# Patient Record
Sex: Female | Born: 1960 | Race: White | Hispanic: No | Marital: Single
Health system: Southern US, Community
[De-identification: ages and names within clinical notes are randomized; demographics above are authoritative.]

---

## 2000-03-27 ENCOUNTER — Encounter: Payer: Self-pay | Admitting: Unknown Physician Specialty

## 2000-03-27 ENCOUNTER — Encounter: Admission: RE | Admit: 2000-03-27 | Discharge: 2000-03-27 | Payer: Self-pay | Admitting: Unknown Physician Specialty

## 2001-11-28 ENCOUNTER — Ambulatory Visit (HOSPITAL_COMMUNITY): Admission: RE | Admit: 2001-11-28 | Discharge: 2001-11-28 | Payer: Self-pay | Admitting: Family Medicine

## 2001-11-28 ENCOUNTER — Encounter: Payer: Self-pay | Admitting: Family Medicine

## 2002-03-31 ENCOUNTER — Encounter: Admission: RE | Admit: 2002-03-31 | Discharge: 2002-03-31 | Payer: Self-pay | Admitting: Unknown Physician Specialty

## 2002-03-31 ENCOUNTER — Encounter: Payer: Self-pay | Admitting: Unknown Physician Specialty

## 2003-04-02 ENCOUNTER — Encounter: Admission: RE | Admit: 2003-04-02 | Discharge: 2003-04-02 | Payer: Self-pay | Admitting: Unknown Physician Specialty

## 2003-04-02 ENCOUNTER — Encounter: Payer: Self-pay | Admitting: Unknown Physician Specialty

## 2004-03-31 ENCOUNTER — Encounter: Admission: RE | Admit: 2004-03-31 | Discharge: 2004-03-31 | Payer: Self-pay | Admitting: Unknown Physician Specialty

## 2005-04-06 ENCOUNTER — Encounter: Admission: RE | Admit: 2005-04-06 | Discharge: 2005-04-06 | Payer: Self-pay | Admitting: Unknown Physician Specialty

## 2006-04-04 ENCOUNTER — Encounter: Admission: RE | Admit: 2006-04-04 | Discharge: 2006-04-04 | Payer: Self-pay | Admitting: Unknown Physician Specialty

## 2007-04-03 ENCOUNTER — Encounter: Admission: RE | Admit: 2007-04-03 | Discharge: 2007-04-03 | Payer: Self-pay | Admitting: Unknown Physician Specialty

## 2008-04-02 ENCOUNTER — Encounter: Admission: RE | Admit: 2008-04-02 | Discharge: 2008-04-02 | Payer: Self-pay | Admitting: Nephrology

## 2009-04-01 ENCOUNTER — Encounter: Admission: RE | Admit: 2009-04-01 | Discharge: 2009-04-01 | Payer: Self-pay | Admitting: Unknown Physician Specialty

## 2010-04-06 ENCOUNTER — Encounter: Admission: RE | Admit: 2010-04-06 | Discharge: 2010-04-06 | Payer: Self-pay | Admitting: Unknown Physician Specialty

## 2010-10-31 ENCOUNTER — Encounter: Payer: Self-pay | Admitting: Family Medicine

## 2011-03-22 ENCOUNTER — Other Ambulatory Visit: Payer: Self-pay | Admitting: *Deleted

## 2011-03-22 DIAGNOSIS — Z1231 Encounter for screening mammogram for malignant neoplasm of breast: Secondary | ICD-10-CM

## 2011-03-23 ENCOUNTER — Ambulatory Visit
Admission: RE | Admit: 2011-03-23 | Discharge: 2011-03-23 | Disposition: A | Discharge status: Home / Self Care | Payer: BC Managed Care – PPO | Source: Ambulatory Visit | Attending: *Deleted | Admitting: *Deleted

## 2011-03-23 DIAGNOSIS — Z1231 Encounter for screening mammogram for malignant neoplasm of breast: Secondary | ICD-10-CM

## 2012-02-26 ENCOUNTER — Other Ambulatory Visit: Payer: Self-pay | Admitting: Obstetrics and Gynecology

## 2012-02-26 DIAGNOSIS — Z1231 Encounter for screening mammogram for malignant neoplasm of breast: Secondary | ICD-10-CM

## 2012-03-21 ENCOUNTER — Ambulatory Visit
Admission: RE | Admit: 2012-03-21 | Discharge: 2012-03-21 | Disposition: A | Discharge status: Home / Self Care | Payer: BC Managed Care – PPO | Source: Ambulatory Visit | Attending: Obstetrics and Gynecology | Admitting: Obstetrics and Gynecology

## 2012-03-21 DIAGNOSIS — Z1231 Encounter for screening mammogram for malignant neoplasm of breast: Secondary | ICD-10-CM

## 2013-03-05 ENCOUNTER — Other Ambulatory Visit: Payer: Self-pay | Admitting: Obstetrics and Gynecology

## 2013-03-05 DIAGNOSIS — Z1231 Encounter for screening mammogram for malignant neoplasm of breast: Secondary | ICD-10-CM

## 2013-03-18 ENCOUNTER — Ambulatory Visit
Admission: RE | Admit: 2013-03-18 | Discharge: 2013-03-18 | Disposition: A | Discharge status: Home / Self Care | Payer: BC Managed Care – PPO | Source: Ambulatory Visit | Attending: Obstetrics and Gynecology | Admitting: Obstetrics and Gynecology

## 2013-03-18 DIAGNOSIS — Z1231 Encounter for screening mammogram for malignant neoplasm of breast: Secondary | ICD-10-CM

## 2014-02-26 ENCOUNTER — Other Ambulatory Visit: Payer: Self-pay | Admitting: Unknown Physician Specialty

## 2014-02-26 DIAGNOSIS — Z1231 Encounter for screening mammogram for malignant neoplasm of breast: Secondary | ICD-10-CM

## 2014-03-19 ENCOUNTER — Ambulatory Visit
Admission: RE | Admit: 2014-03-19 | Discharge: 2014-03-19 | Disposition: A | Discharge status: Home / Self Care | Payer: BC Managed Care – PPO | Source: Ambulatory Visit | Attending: Unknown Physician Specialty | Admitting: Unknown Physician Specialty

## 2014-03-19 DIAGNOSIS — Z1231 Encounter for screening mammogram for malignant neoplasm of breast: Secondary | ICD-10-CM

## 2014-03-24 ENCOUNTER — Other Ambulatory Visit: Payer: Self-pay | Admitting: Unknown Physician Specialty

## 2014-03-24 DIAGNOSIS — R928 Other abnormal and inconclusive findings on diagnostic imaging of breast: Secondary | ICD-10-CM

## 2014-04-01 ENCOUNTER — Ambulatory Visit
Admission: RE | Admit: 2014-04-01 | Discharge: 2014-04-01 | Disposition: A | Discharge status: Home / Self Care | Payer: BC Managed Care – PPO | Source: Ambulatory Visit | Attending: Unknown Physician Specialty | Admitting: Unknown Physician Specialty

## 2014-04-01 DIAGNOSIS — R928 Other abnormal and inconclusive findings on diagnostic imaging of breast: Secondary | ICD-10-CM

## 2015-02-25 ENCOUNTER — Other Ambulatory Visit: Payer: Self-pay

## 2015-02-25 DIAGNOSIS — Z1231 Encounter for screening mammogram for malignant neoplasm of breast: Secondary | ICD-10-CM

## 2015-03-17 ENCOUNTER — Ambulatory Visit
Admission: RE | Admit: 2015-03-17 | Discharge: 2015-03-17 | Disposition: A | Discharge status: Home / Self Care | Payer: BLUE CROSS/BLUE SHIELD | Source: Ambulatory Visit

## 2015-03-17 DIAGNOSIS — Z1231 Encounter for screening mammogram for malignant neoplasm of breast: Secondary | ICD-10-CM

## 2016-03-01 ENCOUNTER — Other Ambulatory Visit: Payer: Self-pay

## 2016-03-01 DIAGNOSIS — Z1231 Encounter for screening mammogram for malignant neoplasm of breast: Secondary | ICD-10-CM

## 2016-03-14 ENCOUNTER — Ambulatory Visit
Admission: RE | Admit: 2016-03-14 | Discharge: 2016-03-14 | Disposition: A | Discharge status: Home / Self Care | Payer: BLUE CROSS/BLUE SHIELD | Source: Ambulatory Visit

## 2016-03-14 DIAGNOSIS — Z1231 Encounter for screening mammogram for malignant neoplasm of breast: Secondary | ICD-10-CM

## 2017-01-18 ENCOUNTER — Other Ambulatory Visit (INDEPENDENT_AMBULATORY_CARE_PROVIDER_SITE_OTHER): Payer: Self-pay | Admitting: Orthopaedic Surgery

## 2017-01-18 NOTE — Telephone Encounter (Signed)
PLEASE ADVISE.

## 2017-01-24 DIAGNOSIS — Z01419 Encounter for gynecological examination (general) (routine) without abnormal findings: Secondary | ICD-10-CM | POA: Diagnosis not present

## 2017-01-24 DIAGNOSIS — Z6823 Body mass index (BMI) 23.0-23.9, adult: Secondary | ICD-10-CM | POA: Diagnosis not present

## 2017-02-28 ENCOUNTER — Other Ambulatory Visit: Payer: Self-pay | Admitting: Obstetrics and Gynecology

## 2017-02-28 DIAGNOSIS — Z1231 Encounter for screening mammogram for malignant neoplasm of breast: Secondary | ICD-10-CM

## 2017-03-21 ENCOUNTER — Ambulatory Visit
Admission: RE | Admit: 2017-03-21 | Discharge: 2017-03-21 | Disposition: A | Discharge status: Home / Self Care | Payer: BLUE CROSS/BLUE SHIELD | Source: Ambulatory Visit | Attending: Obstetrics and Gynecology | Admitting: Obstetrics and Gynecology

## 2017-03-21 DIAGNOSIS — Z1231 Encounter for screening mammogram for malignant neoplasm of breast: Secondary | ICD-10-CM | POA: Diagnosis not present

## 2017-06-26 DIAGNOSIS — M9905 Segmental and somatic dysfunction of pelvic region: Secondary | ICD-10-CM | POA: Diagnosis not present

## 2017-06-26 DIAGNOSIS — M9903 Segmental and somatic dysfunction of lumbar region: Secondary | ICD-10-CM | POA: Diagnosis not present

## 2017-06-26 DIAGNOSIS — M5441 Lumbago with sciatica, right side: Secondary | ICD-10-CM | POA: Diagnosis not present

## 2017-06-26 DIAGNOSIS — M7071 Other bursitis of hip, right hip: Secondary | ICD-10-CM | POA: Diagnosis not present

## 2017-07-02 DIAGNOSIS — M5441 Lumbago with sciatica, right side: Secondary | ICD-10-CM | POA: Diagnosis not present

## 2017-07-02 DIAGNOSIS — M9903 Segmental and somatic dysfunction of lumbar region: Secondary | ICD-10-CM | POA: Diagnosis not present

## 2017-07-02 DIAGNOSIS — M7071 Other bursitis of hip, right hip: Secondary | ICD-10-CM | POA: Diagnosis not present

## 2017-07-02 DIAGNOSIS — M9905 Segmental and somatic dysfunction of pelvic region: Secondary | ICD-10-CM | POA: Diagnosis not present

## 2017-07-05 DIAGNOSIS — M9903 Segmental and somatic dysfunction of lumbar region: Secondary | ICD-10-CM | POA: Diagnosis not present

## 2017-07-05 DIAGNOSIS — M7071 Other bursitis of hip, right hip: Secondary | ICD-10-CM | POA: Diagnosis not present

## 2017-07-05 DIAGNOSIS — M9905 Segmental and somatic dysfunction of pelvic region: Secondary | ICD-10-CM | POA: Diagnosis not present

## 2017-07-05 DIAGNOSIS — M5441 Lumbago with sciatica, right side: Secondary | ICD-10-CM | POA: Diagnosis not present

## 2017-07-11 DIAGNOSIS — M7071 Other bursitis of hip, right hip: Secondary | ICD-10-CM | POA: Diagnosis not present

## 2017-07-11 DIAGNOSIS — M9903 Segmental and somatic dysfunction of lumbar region: Secondary | ICD-10-CM | POA: Diagnosis not present

## 2017-07-11 DIAGNOSIS — M9905 Segmental and somatic dysfunction of pelvic region: Secondary | ICD-10-CM | POA: Diagnosis not present

## 2017-07-11 DIAGNOSIS — M5441 Lumbago with sciatica, right side: Secondary | ICD-10-CM | POA: Diagnosis not present

## 2017-07-18 DIAGNOSIS — M9905 Segmental and somatic dysfunction of pelvic region: Secondary | ICD-10-CM | POA: Diagnosis not present

## 2017-07-18 DIAGNOSIS — M9903 Segmental and somatic dysfunction of lumbar region: Secondary | ICD-10-CM | POA: Diagnosis not present

## 2017-07-18 DIAGNOSIS — M7071 Other bursitis of hip, right hip: Secondary | ICD-10-CM | POA: Diagnosis not present

## 2017-07-18 DIAGNOSIS — M5441 Lumbago with sciatica, right side: Secondary | ICD-10-CM | POA: Diagnosis not present

## 2017-07-23 DIAGNOSIS — M9903 Segmental and somatic dysfunction of lumbar region: Secondary | ICD-10-CM | POA: Diagnosis not present

## 2017-07-23 DIAGNOSIS — M7071 Other bursitis of hip, right hip: Secondary | ICD-10-CM | POA: Diagnosis not present

## 2017-07-23 DIAGNOSIS — M9905 Segmental and somatic dysfunction of pelvic region: Secondary | ICD-10-CM | POA: Diagnosis not present

## 2017-07-23 DIAGNOSIS — M5441 Lumbago with sciatica, right side: Secondary | ICD-10-CM | POA: Diagnosis not present

## 2017-08-01 DIAGNOSIS — M9903 Segmental and somatic dysfunction of lumbar region: Secondary | ICD-10-CM | POA: Diagnosis not present

## 2017-08-01 DIAGNOSIS — M9905 Segmental and somatic dysfunction of pelvic region: Secondary | ICD-10-CM | POA: Diagnosis not present

## 2017-08-01 DIAGNOSIS — M7071 Other bursitis of hip, right hip: Secondary | ICD-10-CM | POA: Diagnosis not present

## 2017-08-01 DIAGNOSIS — M5441 Lumbago with sciatica, right side: Secondary | ICD-10-CM | POA: Diagnosis not present

## 2017-08-15 DIAGNOSIS — M7071 Other bursitis of hip, right hip: Secondary | ICD-10-CM | POA: Diagnosis not present

## 2017-08-15 DIAGNOSIS — M9905 Segmental and somatic dysfunction of pelvic region: Secondary | ICD-10-CM | POA: Diagnosis not present

## 2017-08-15 DIAGNOSIS — M9903 Segmental and somatic dysfunction of lumbar region: Secondary | ICD-10-CM | POA: Diagnosis not present

## 2017-08-15 DIAGNOSIS — M5441 Lumbago with sciatica, right side: Secondary | ICD-10-CM | POA: Diagnosis not present

## 2017-09-14 DIAGNOSIS — M7071 Other bursitis of hip, right hip: Secondary | ICD-10-CM | POA: Diagnosis not present

## 2017-09-14 DIAGNOSIS — M9903 Segmental and somatic dysfunction of lumbar region: Secondary | ICD-10-CM | POA: Diagnosis not present

## 2017-09-14 DIAGNOSIS — M5441 Lumbago with sciatica, right side: Secondary | ICD-10-CM | POA: Diagnosis not present

## 2017-09-14 DIAGNOSIS — M9905 Segmental and somatic dysfunction of pelvic region: Secondary | ICD-10-CM | POA: Diagnosis not present

## 2017-09-26 DIAGNOSIS — M9903 Segmental and somatic dysfunction of lumbar region: Secondary | ICD-10-CM | POA: Diagnosis not present

## 2017-09-26 DIAGNOSIS — M7071 Other bursitis of hip, right hip: Secondary | ICD-10-CM | POA: Diagnosis not present

## 2017-09-26 DIAGNOSIS — M5441 Lumbago with sciatica, right side: Secondary | ICD-10-CM | POA: Diagnosis not present

## 2017-09-26 DIAGNOSIS — M9905 Segmental and somatic dysfunction of pelvic region: Secondary | ICD-10-CM | POA: Diagnosis not present

## 2017-10-17 DIAGNOSIS — M7071 Other bursitis of hip, right hip: Secondary | ICD-10-CM | POA: Diagnosis not present

## 2017-10-17 DIAGNOSIS — M5441 Lumbago with sciatica, right side: Secondary | ICD-10-CM | POA: Diagnosis not present

## 2017-10-17 DIAGNOSIS — M9903 Segmental and somatic dysfunction of lumbar region: Secondary | ICD-10-CM | POA: Diagnosis not present

## 2017-10-17 DIAGNOSIS — M9905 Segmental and somatic dysfunction of pelvic region: Secondary | ICD-10-CM | POA: Diagnosis not present

## 2017-11-14 DIAGNOSIS — M9905 Segmental and somatic dysfunction of pelvic region: Secondary | ICD-10-CM | POA: Diagnosis not present

## 2017-11-14 DIAGNOSIS — M9903 Segmental and somatic dysfunction of lumbar region: Secondary | ICD-10-CM | POA: Diagnosis not present

## 2017-11-14 DIAGNOSIS — M7071 Other bursitis of hip, right hip: Secondary | ICD-10-CM | POA: Diagnosis not present

## 2017-11-14 DIAGNOSIS — M5441 Lumbago with sciatica, right side: Secondary | ICD-10-CM | POA: Diagnosis not present

## 2017-12-12 DIAGNOSIS — M9905 Segmental and somatic dysfunction of pelvic region: Secondary | ICD-10-CM | POA: Diagnosis not present

## 2017-12-12 DIAGNOSIS — M9903 Segmental and somatic dysfunction of lumbar region: Secondary | ICD-10-CM | POA: Diagnosis not present

## 2017-12-12 DIAGNOSIS — M7071 Other bursitis of hip, right hip: Secondary | ICD-10-CM | POA: Diagnosis not present

## 2017-12-12 DIAGNOSIS — M5441 Lumbago with sciatica, right side: Secondary | ICD-10-CM | POA: Diagnosis not present

## 2018-01-16 DIAGNOSIS — M9903 Segmental and somatic dysfunction of lumbar region: Secondary | ICD-10-CM | POA: Diagnosis not present

## 2018-01-16 DIAGNOSIS — M7918 Myalgia, other site: Secondary | ICD-10-CM | POA: Diagnosis not present

## 2018-01-16 DIAGNOSIS — M9905 Segmental and somatic dysfunction of pelvic region: Secondary | ICD-10-CM | POA: Diagnosis not present

## 2018-01-16 DIAGNOSIS — M9904 Segmental and somatic dysfunction of sacral region: Secondary | ICD-10-CM | POA: Diagnosis not present

## 2018-02-13 DIAGNOSIS — Z6823 Body mass index (BMI) 23.0-23.9, adult: Secondary | ICD-10-CM | POA: Diagnosis not present

## 2018-02-13 DIAGNOSIS — M9904 Segmental and somatic dysfunction of sacral region: Secondary | ICD-10-CM | POA: Diagnosis not present

## 2018-02-13 DIAGNOSIS — Z124 Encounter for screening for malignant neoplasm of cervix: Secondary | ICD-10-CM | POA: Diagnosis not present

## 2018-02-13 DIAGNOSIS — M9905 Segmental and somatic dysfunction of pelvic region: Secondary | ICD-10-CM | POA: Diagnosis not present

## 2018-02-13 DIAGNOSIS — M7918 Myalgia, other site: Secondary | ICD-10-CM | POA: Diagnosis not present

## 2018-02-13 DIAGNOSIS — Z01419 Encounter for gynecological examination (general) (routine) without abnormal findings: Secondary | ICD-10-CM | POA: Diagnosis not present

## 2018-02-13 DIAGNOSIS — M9903 Segmental and somatic dysfunction of lumbar region: Secondary | ICD-10-CM | POA: Diagnosis not present

## 2018-03-13 DIAGNOSIS — M9905 Segmental and somatic dysfunction of pelvic region: Secondary | ICD-10-CM | POA: Diagnosis not present

## 2018-03-13 DIAGNOSIS — M9903 Segmental and somatic dysfunction of lumbar region: Secondary | ICD-10-CM | POA: Diagnosis not present

## 2018-03-13 DIAGNOSIS — M7918 Myalgia, other site: Secondary | ICD-10-CM | POA: Diagnosis not present

## 2018-03-13 DIAGNOSIS — M9904 Segmental and somatic dysfunction of sacral region: Secondary | ICD-10-CM | POA: Diagnosis not present

## 2018-04-24 DIAGNOSIS — M9903 Segmental and somatic dysfunction of lumbar region: Secondary | ICD-10-CM | POA: Diagnosis not present

## 2018-04-24 DIAGNOSIS — M7918 Myalgia, other site: Secondary | ICD-10-CM | POA: Diagnosis not present

## 2018-04-24 DIAGNOSIS — M9905 Segmental and somatic dysfunction of pelvic region: Secondary | ICD-10-CM | POA: Diagnosis not present

## 2018-04-24 DIAGNOSIS — M9904 Segmental and somatic dysfunction of sacral region: Secondary | ICD-10-CM | POA: Diagnosis not present

## 2018-05-06 ENCOUNTER — Other Ambulatory Visit: Payer: Self-pay | Admitting: Obstetrics and Gynecology

## 2018-05-06 DIAGNOSIS — Z139 Encounter for screening, unspecified: Secondary | ICD-10-CM

## 2018-05-29 DIAGNOSIS — M9905 Segmental and somatic dysfunction of pelvic region: Secondary | ICD-10-CM | POA: Diagnosis not present

## 2018-05-29 DIAGNOSIS — M7918 Myalgia, other site: Secondary | ICD-10-CM | POA: Diagnosis not present

## 2018-05-29 DIAGNOSIS — M9904 Segmental and somatic dysfunction of sacral region: Secondary | ICD-10-CM | POA: Diagnosis not present

## 2018-05-29 DIAGNOSIS — M9903 Segmental and somatic dysfunction of lumbar region: Secondary | ICD-10-CM | POA: Diagnosis not present

## 2018-06-06 ENCOUNTER — Ambulatory Visit
Admission: RE | Admit: 2018-06-06 | Discharge: 2018-06-06 | Disposition: A | Discharge status: Home / Self Care | Payer: BLUE CROSS/BLUE SHIELD | Source: Ambulatory Visit | Attending: Obstetrics and Gynecology | Admitting: Obstetrics and Gynecology

## 2018-06-06 DIAGNOSIS — Z1231 Encounter for screening mammogram for malignant neoplasm of breast: Secondary | ICD-10-CM | POA: Diagnosis not present

## 2018-06-06 DIAGNOSIS — Z139 Encounter for screening, unspecified: Secondary | ICD-10-CM

## 2018-06-26 DIAGNOSIS — M9903 Segmental and somatic dysfunction of lumbar region: Secondary | ICD-10-CM | POA: Diagnosis not present

## 2018-06-26 DIAGNOSIS — M9904 Segmental and somatic dysfunction of sacral region: Secondary | ICD-10-CM | POA: Diagnosis not present

## 2018-06-26 DIAGNOSIS — M7918 Myalgia, other site: Secondary | ICD-10-CM | POA: Diagnosis not present

## 2018-06-26 DIAGNOSIS — M9905 Segmental and somatic dysfunction of pelvic region: Secondary | ICD-10-CM | POA: Diagnosis not present

## 2018-07-24 DIAGNOSIS — M9904 Segmental and somatic dysfunction of sacral region: Secondary | ICD-10-CM | POA: Diagnosis not present

## 2018-07-24 DIAGNOSIS — M9903 Segmental and somatic dysfunction of lumbar region: Secondary | ICD-10-CM | POA: Diagnosis not present

## 2018-07-24 DIAGNOSIS — M722 Plantar fascial fibromatosis: Secondary | ICD-10-CM | POA: Diagnosis not present

## 2018-07-24 DIAGNOSIS — M9905 Segmental and somatic dysfunction of pelvic region: Secondary | ICD-10-CM | POA: Diagnosis not present

## 2018-08-28 DIAGNOSIS — M9904 Segmental and somatic dysfunction of sacral region: Secondary | ICD-10-CM | POA: Diagnosis not present

## 2018-08-28 DIAGNOSIS — M9903 Segmental and somatic dysfunction of lumbar region: Secondary | ICD-10-CM | POA: Diagnosis not present

## 2018-08-28 DIAGNOSIS — M9905 Segmental and somatic dysfunction of pelvic region: Secondary | ICD-10-CM | POA: Diagnosis not present

## 2018-08-28 DIAGNOSIS — M722 Plantar fascial fibromatosis: Secondary | ICD-10-CM | POA: Diagnosis not present

## 2018-09-25 DIAGNOSIS — M9904 Segmental and somatic dysfunction of sacral region: Secondary | ICD-10-CM | POA: Diagnosis not present

## 2018-09-25 DIAGNOSIS — M9903 Segmental and somatic dysfunction of lumbar region: Secondary | ICD-10-CM | POA: Diagnosis not present

## 2018-09-25 DIAGNOSIS — M9905 Segmental and somatic dysfunction of pelvic region: Secondary | ICD-10-CM | POA: Diagnosis not present

## 2018-09-25 DIAGNOSIS — M722 Plantar fascial fibromatosis: Secondary | ICD-10-CM | POA: Diagnosis not present

## 2018-10-30 DIAGNOSIS — M722 Plantar fascial fibromatosis: Secondary | ICD-10-CM | POA: Diagnosis not present

## 2018-10-30 DIAGNOSIS — M9904 Segmental and somatic dysfunction of sacral region: Secondary | ICD-10-CM | POA: Diagnosis not present

## 2018-10-30 DIAGNOSIS — M9905 Segmental and somatic dysfunction of pelvic region: Secondary | ICD-10-CM | POA: Diagnosis not present

## 2018-10-30 DIAGNOSIS — M9903 Segmental and somatic dysfunction of lumbar region: Secondary | ICD-10-CM | POA: Diagnosis not present

## 2018-12-04 DIAGNOSIS — M9905 Segmental and somatic dysfunction of pelvic region: Secondary | ICD-10-CM | POA: Diagnosis not present

## 2018-12-04 DIAGNOSIS — M9904 Segmental and somatic dysfunction of sacral region: Secondary | ICD-10-CM | POA: Diagnosis not present

## 2018-12-04 DIAGNOSIS — M9903 Segmental and somatic dysfunction of lumbar region: Secondary | ICD-10-CM | POA: Diagnosis not present

## 2018-12-04 DIAGNOSIS — M722 Plantar fascial fibromatosis: Secondary | ICD-10-CM | POA: Diagnosis not present

## 2019-01-15 DIAGNOSIS — M7062 Trochanteric bursitis, left hip: Secondary | ICD-10-CM | POA: Diagnosis not present

## 2019-01-15 DIAGNOSIS — M9905 Segmental and somatic dysfunction of pelvic region: Secondary | ICD-10-CM | POA: Diagnosis not present

## 2019-01-15 DIAGNOSIS — M9904 Segmental and somatic dysfunction of sacral region: Secondary | ICD-10-CM | POA: Diagnosis not present

## 2019-01-15 DIAGNOSIS — M9903 Segmental and somatic dysfunction of lumbar region: Secondary | ICD-10-CM | POA: Diagnosis not present

## 2019-01-20 DIAGNOSIS — M7062 Trochanteric bursitis, left hip: Secondary | ICD-10-CM | POA: Diagnosis not present

## 2019-01-20 DIAGNOSIS — M9905 Segmental and somatic dysfunction of pelvic region: Secondary | ICD-10-CM | POA: Diagnosis not present

## 2019-01-20 DIAGNOSIS — M9903 Segmental and somatic dysfunction of lumbar region: Secondary | ICD-10-CM | POA: Diagnosis not present

## 2019-01-20 DIAGNOSIS — M9904 Segmental and somatic dysfunction of sacral region: Secondary | ICD-10-CM | POA: Diagnosis not present

## 2019-01-27 DIAGNOSIS — M9904 Segmental and somatic dysfunction of sacral region: Secondary | ICD-10-CM | POA: Diagnosis not present

## 2019-01-27 DIAGNOSIS — M9903 Segmental and somatic dysfunction of lumbar region: Secondary | ICD-10-CM | POA: Diagnosis not present

## 2019-01-27 DIAGNOSIS — M7062 Trochanteric bursitis, left hip: Secondary | ICD-10-CM | POA: Diagnosis not present

## 2019-01-27 DIAGNOSIS — M9905 Segmental and somatic dysfunction of pelvic region: Secondary | ICD-10-CM | POA: Diagnosis not present

## 2019-02-04 DIAGNOSIS — M9903 Segmental and somatic dysfunction of lumbar region: Secondary | ICD-10-CM | POA: Diagnosis not present

## 2019-02-04 DIAGNOSIS — M9905 Segmental and somatic dysfunction of pelvic region: Secondary | ICD-10-CM | POA: Diagnosis not present

## 2019-02-04 DIAGNOSIS — M7062 Trochanteric bursitis, left hip: Secondary | ICD-10-CM | POA: Diagnosis not present

## 2019-02-04 DIAGNOSIS — M9904 Segmental and somatic dysfunction of sacral region: Secondary | ICD-10-CM | POA: Diagnosis not present

## 2019-02-17 DIAGNOSIS — M9903 Segmental and somatic dysfunction of lumbar region: Secondary | ICD-10-CM | POA: Diagnosis not present

## 2019-02-17 DIAGNOSIS — M9904 Segmental and somatic dysfunction of sacral region: Secondary | ICD-10-CM | POA: Diagnosis not present

## 2019-02-17 DIAGNOSIS — M7062 Trochanteric bursitis, left hip: Secondary | ICD-10-CM | POA: Diagnosis not present

## 2019-02-17 DIAGNOSIS — M9905 Segmental and somatic dysfunction of pelvic region: Secondary | ICD-10-CM | POA: Diagnosis not present

## 2019-03-04 DIAGNOSIS — M9904 Segmental and somatic dysfunction of sacral region: Secondary | ICD-10-CM | POA: Diagnosis not present

## 2019-03-04 DIAGNOSIS — M9903 Segmental and somatic dysfunction of lumbar region: Secondary | ICD-10-CM | POA: Diagnosis not present

## 2019-03-04 DIAGNOSIS — M7062 Trochanteric bursitis, left hip: Secondary | ICD-10-CM | POA: Diagnosis not present

## 2019-03-04 DIAGNOSIS — M9905 Segmental and somatic dysfunction of pelvic region: Secondary | ICD-10-CM | POA: Diagnosis not present

## 2019-03-12 DIAGNOSIS — Z6824 Body mass index (BMI) 24.0-24.9, adult: Secondary | ICD-10-CM | POA: Diagnosis not present

## 2019-03-12 DIAGNOSIS — Z01419 Encounter for gynecological examination (general) (routine) without abnormal findings: Secondary | ICD-10-CM | POA: Diagnosis not present

## 2019-03-26 DIAGNOSIS — M7062 Trochanteric bursitis, left hip: Secondary | ICD-10-CM | POA: Diagnosis not present

## 2019-03-26 DIAGNOSIS — M9904 Segmental and somatic dysfunction of sacral region: Secondary | ICD-10-CM | POA: Diagnosis not present

## 2019-03-26 DIAGNOSIS — M9905 Segmental and somatic dysfunction of pelvic region: Secondary | ICD-10-CM | POA: Diagnosis not present

## 2019-03-26 DIAGNOSIS — M9903 Segmental and somatic dysfunction of lumbar region: Secondary | ICD-10-CM | POA: Diagnosis not present

## 2019-04-23 DIAGNOSIS — M7062 Trochanteric bursitis, left hip: Secondary | ICD-10-CM | POA: Diagnosis not present

## 2019-04-23 DIAGNOSIS — M9905 Segmental and somatic dysfunction of pelvic region: Secondary | ICD-10-CM | POA: Diagnosis not present

## 2019-04-23 DIAGNOSIS — M9902 Segmental and somatic dysfunction of thoracic region: Secondary | ICD-10-CM | POA: Diagnosis not present

## 2019-04-23 DIAGNOSIS — M9903 Segmental and somatic dysfunction of lumbar region: Secondary | ICD-10-CM | POA: Diagnosis not present

## 2019-04-24 ENCOUNTER — Other Ambulatory Visit: Payer: Self-pay | Admitting: Obstetrics and Gynecology

## 2019-04-24 DIAGNOSIS — Z1231 Encounter for screening mammogram for malignant neoplasm of breast: Secondary | ICD-10-CM

## 2019-05-21 DIAGNOSIS — M9902 Segmental and somatic dysfunction of thoracic region: Secondary | ICD-10-CM | POA: Diagnosis not present

## 2019-05-21 DIAGNOSIS — M9903 Segmental and somatic dysfunction of lumbar region: Secondary | ICD-10-CM | POA: Diagnosis not present

## 2019-05-21 DIAGNOSIS — M9904 Segmental and somatic dysfunction of sacral region: Secondary | ICD-10-CM | POA: Diagnosis not present

## 2019-05-21 DIAGNOSIS — M9905 Segmental and somatic dysfunction of pelvic region: Secondary | ICD-10-CM | POA: Diagnosis not present

## 2019-06-09 ENCOUNTER — Ambulatory Visit
Admission: RE | Admit: 2019-06-09 | Discharge: 2019-06-09 | Disposition: A | Discharge status: Home / Self Care | Payer: BC Managed Care – PPO | Source: Ambulatory Visit | Attending: Obstetrics and Gynecology | Admitting: Obstetrics and Gynecology

## 2019-06-09 ENCOUNTER — Other Ambulatory Visit: Payer: Self-pay

## 2019-06-09 DIAGNOSIS — Z1231 Encounter for screening mammogram for malignant neoplasm of breast: Secondary | ICD-10-CM

## 2019-07-02 DIAGNOSIS — M9903 Segmental and somatic dysfunction of lumbar region: Secondary | ICD-10-CM | POA: Diagnosis not present

## 2019-07-02 DIAGNOSIS — M9902 Segmental and somatic dysfunction of thoracic region: Secondary | ICD-10-CM | POA: Diagnosis not present

## 2019-07-02 DIAGNOSIS — M9905 Segmental and somatic dysfunction of pelvic region: Secondary | ICD-10-CM | POA: Diagnosis not present

## 2019-07-02 DIAGNOSIS — M9904 Segmental and somatic dysfunction of sacral region: Secondary | ICD-10-CM | POA: Diagnosis not present

## 2019-07-17 DIAGNOSIS — Z23 Encounter for immunization: Secondary | ICD-10-CM | POA: Diagnosis not present

## 2019-08-13 DIAGNOSIS — M9902 Segmental and somatic dysfunction of thoracic region: Secondary | ICD-10-CM | POA: Diagnosis not present

## 2019-08-13 DIAGNOSIS — M9905 Segmental and somatic dysfunction of pelvic region: Secondary | ICD-10-CM | POA: Diagnosis not present

## 2019-08-13 DIAGNOSIS — M9903 Segmental and somatic dysfunction of lumbar region: Secondary | ICD-10-CM | POA: Diagnosis not present

## 2019-08-13 DIAGNOSIS — M9904 Segmental and somatic dysfunction of sacral region: Secondary | ICD-10-CM | POA: Diagnosis not present

## 2019-09-10 DIAGNOSIS — M9904 Segmental and somatic dysfunction of sacral region: Secondary | ICD-10-CM | POA: Diagnosis not present

## 2019-09-10 DIAGNOSIS — M9905 Segmental and somatic dysfunction of pelvic region: Secondary | ICD-10-CM | POA: Diagnosis not present

## 2019-09-10 DIAGNOSIS — M7672 Peroneal tendinitis, left leg: Secondary | ICD-10-CM | POA: Diagnosis not present

## 2019-09-10 DIAGNOSIS — M9903 Segmental and somatic dysfunction of lumbar region: Secondary | ICD-10-CM | POA: Diagnosis not present

## 2019-09-11 ENCOUNTER — Other Ambulatory Visit: Payer: Self-pay | Admitting: Chiropractic Medicine

## 2019-09-11 ENCOUNTER — Ambulatory Visit
Admission: RE | Admit: 2019-09-11 | Discharge: 2019-09-11 | Disposition: A | Discharge status: Home / Self Care | Payer: BC Managed Care – PPO | Source: Ambulatory Visit | Attending: Chiropractic Medicine | Admitting: Chiropractic Medicine

## 2019-09-11 DIAGNOSIS — M25572 Pain in left ankle and joints of left foot: Secondary | ICD-10-CM

## 2019-09-11 DIAGNOSIS — M19072 Primary osteoarthritis, left ankle and foot: Secondary | ICD-10-CM | POA: Diagnosis not present

## 2019-09-17 DIAGNOSIS — M9903 Segmental and somatic dysfunction of lumbar region: Secondary | ICD-10-CM | POA: Diagnosis not present

## 2019-09-17 DIAGNOSIS — M9904 Segmental and somatic dysfunction of sacral region: Secondary | ICD-10-CM | POA: Diagnosis not present

## 2019-09-17 DIAGNOSIS — M9905 Segmental and somatic dysfunction of pelvic region: Secondary | ICD-10-CM | POA: Diagnosis not present

## 2019-09-17 DIAGNOSIS — M7672 Peroneal tendinitis, left leg: Secondary | ICD-10-CM | POA: Diagnosis not present

## 2019-09-24 DIAGNOSIS — M9905 Segmental and somatic dysfunction of pelvic region: Secondary | ICD-10-CM | POA: Diagnosis not present

## 2019-09-24 DIAGNOSIS — M9904 Segmental and somatic dysfunction of sacral region: Secondary | ICD-10-CM | POA: Diagnosis not present

## 2019-09-24 DIAGNOSIS — M7672 Peroneal tendinitis, left leg: Secondary | ICD-10-CM | POA: Diagnosis not present

## 2019-09-24 DIAGNOSIS — M9903 Segmental and somatic dysfunction of lumbar region: Secondary | ICD-10-CM | POA: Diagnosis not present

## 2019-10-13 DIAGNOSIS — M9903 Segmental and somatic dysfunction of lumbar region: Secondary | ICD-10-CM | POA: Diagnosis not present

## 2019-10-13 DIAGNOSIS — M9905 Segmental and somatic dysfunction of pelvic region: Secondary | ICD-10-CM | POA: Diagnosis not present

## 2019-10-13 DIAGNOSIS — M9904 Segmental and somatic dysfunction of sacral region: Secondary | ICD-10-CM | POA: Diagnosis not present

## 2019-10-13 DIAGNOSIS — M7672 Peroneal tendinitis, left leg: Secondary | ICD-10-CM | POA: Diagnosis not present

## 2019-10-24 DIAGNOSIS — M9905 Segmental and somatic dysfunction of pelvic region: Secondary | ICD-10-CM | POA: Diagnosis not present

## 2019-10-24 DIAGNOSIS — M7672 Peroneal tendinitis, left leg: Secondary | ICD-10-CM | POA: Diagnosis not present

## 2019-10-24 DIAGNOSIS — M9903 Segmental and somatic dysfunction of lumbar region: Secondary | ICD-10-CM | POA: Diagnosis not present

## 2019-10-24 DIAGNOSIS — M9904 Segmental and somatic dysfunction of sacral region: Secondary | ICD-10-CM | POA: Diagnosis not present

## 2019-11-14 DIAGNOSIS — M7672 Peroneal tendinitis, left leg: Secondary | ICD-10-CM | POA: Diagnosis not present

## 2019-11-14 DIAGNOSIS — M9904 Segmental and somatic dysfunction of sacral region: Secondary | ICD-10-CM | POA: Diagnosis not present

## 2019-11-14 DIAGNOSIS — M9903 Segmental and somatic dysfunction of lumbar region: Secondary | ICD-10-CM | POA: Diagnosis not present

## 2019-11-14 DIAGNOSIS — M9905 Segmental and somatic dysfunction of pelvic region: Secondary | ICD-10-CM | POA: Diagnosis not present

## 2019-11-28 DIAGNOSIS — M9904 Segmental and somatic dysfunction of sacral region: Secondary | ICD-10-CM | POA: Diagnosis not present

## 2019-11-28 DIAGNOSIS — M9903 Segmental and somatic dysfunction of lumbar region: Secondary | ICD-10-CM | POA: Diagnosis not present

## 2019-11-28 DIAGNOSIS — M9905 Segmental and somatic dysfunction of pelvic region: Secondary | ICD-10-CM | POA: Diagnosis not present

## 2019-11-28 DIAGNOSIS — M7672 Peroneal tendinitis, left leg: Secondary | ICD-10-CM | POA: Diagnosis not present

## 2019-12-19 DIAGNOSIS — M7672 Peroneal tendinitis, left leg: Secondary | ICD-10-CM | POA: Diagnosis not present

## 2019-12-19 DIAGNOSIS — M9905 Segmental and somatic dysfunction of pelvic region: Secondary | ICD-10-CM | POA: Diagnosis not present

## 2019-12-19 DIAGNOSIS — M9904 Segmental and somatic dysfunction of sacral region: Secondary | ICD-10-CM | POA: Diagnosis not present

## 2019-12-19 DIAGNOSIS — M9903 Segmental and somatic dysfunction of lumbar region: Secondary | ICD-10-CM | POA: Diagnosis not present

## 2020-01-09 DIAGNOSIS — M7672 Peroneal tendinitis, left leg: Secondary | ICD-10-CM | POA: Diagnosis not present

## 2020-01-09 DIAGNOSIS — M9903 Segmental and somatic dysfunction of lumbar region: Secondary | ICD-10-CM | POA: Diagnosis not present

## 2020-01-09 DIAGNOSIS — M9905 Segmental and somatic dysfunction of pelvic region: Secondary | ICD-10-CM | POA: Diagnosis not present

## 2020-01-09 DIAGNOSIS — M9904 Segmental and somatic dysfunction of sacral region: Secondary | ICD-10-CM | POA: Diagnosis not present

## 2020-02-05 DIAGNOSIS — M7672 Peroneal tendinitis, left leg: Secondary | ICD-10-CM | POA: Diagnosis not present

## 2020-02-05 DIAGNOSIS — M9903 Segmental and somatic dysfunction of lumbar region: Secondary | ICD-10-CM | POA: Diagnosis not present

## 2020-02-05 DIAGNOSIS — M9904 Segmental and somatic dysfunction of sacral region: Secondary | ICD-10-CM | POA: Diagnosis not present

## 2020-02-05 DIAGNOSIS — M9905 Segmental and somatic dysfunction of pelvic region: Secondary | ICD-10-CM | POA: Diagnosis not present

## 2020-03-16 DIAGNOSIS — M9903 Segmental and somatic dysfunction of lumbar region: Secondary | ICD-10-CM | POA: Diagnosis not present

## 2020-03-16 DIAGNOSIS — M7672 Peroneal tendinitis, left leg: Secondary | ICD-10-CM | POA: Diagnosis not present

## 2020-03-16 DIAGNOSIS — M9905 Segmental and somatic dysfunction of pelvic region: Secondary | ICD-10-CM | POA: Diagnosis not present

## 2020-03-16 DIAGNOSIS — M9904 Segmental and somatic dysfunction of sacral region: Secondary | ICD-10-CM | POA: Diagnosis not present

## 2020-03-31 DIAGNOSIS — Z01419 Encounter for gynecological examination (general) (routine) without abnormal findings: Secondary | ICD-10-CM | POA: Diagnosis not present

## 2020-03-31 DIAGNOSIS — Z6824 Body mass index (BMI) 24.0-24.9, adult: Secondary | ICD-10-CM | POA: Diagnosis not present

## 2020-04-26 ENCOUNTER — Other Ambulatory Visit: Payer: Self-pay | Admitting: Family Medicine

## 2020-04-26 DIAGNOSIS — Z1231 Encounter for screening mammogram for malignant neoplasm of breast: Secondary | ICD-10-CM

## 2020-05-12 DIAGNOSIS — J0110 Acute frontal sinusitis, unspecified: Secondary | ICD-10-CM | POA: Diagnosis not present

## 2020-05-13 DIAGNOSIS — J069 Acute upper respiratory infection, unspecified: Secondary | ICD-10-CM | POA: Diagnosis not present

## 2020-05-22 DIAGNOSIS — H6981 Other specified disorders of Eustachian tube, right ear: Secondary | ICD-10-CM | POA: Diagnosis not present

## 2020-05-31 DIAGNOSIS — H6981 Other specified disorders of Eustachian tube, right ear: Secondary | ICD-10-CM | POA: Diagnosis not present

## 2020-06-10 ENCOUNTER — Ambulatory Visit
Admission: RE | Admit: 2020-06-10 | Discharge: 2020-06-10 | Disposition: A | Discharge status: Home / Self Care | Payer: Self-pay | Source: Ambulatory Visit | Attending: Family Medicine | Admitting: Family Medicine

## 2020-06-10 ENCOUNTER — Other Ambulatory Visit: Payer: Self-pay

## 2020-06-10 DIAGNOSIS — Z1231 Encounter for screening mammogram for malignant neoplasm of breast: Secondary | ICD-10-CM | POA: Diagnosis not present

## 2020-07-26 DIAGNOSIS — N3000 Acute cystitis without hematuria: Secondary | ICD-10-CM | POA: Diagnosis not present

## 2020-08-27 DIAGNOSIS — H6983 Other specified disorders of Eustachian tube, bilateral: Secondary | ICD-10-CM | POA: Diagnosis not present

## 2020-08-27 DIAGNOSIS — H903 Sensorineural hearing loss, bilateral: Secondary | ICD-10-CM | POA: Diagnosis not present

## 2020-09-28 IMAGING — MG DIGITAL SCREENING BILAT W/ TOMO W/ CAD
8 series · 9 of 24 positions shown · non-contrast
Comparison: Previous exam(s).

CLINICAL DATA: Screening.

EXAM:
DIGITAL SCREENING BILATERAL MAMMOGRAM WITH TOMO AND CAD

[L CC synth-2D]
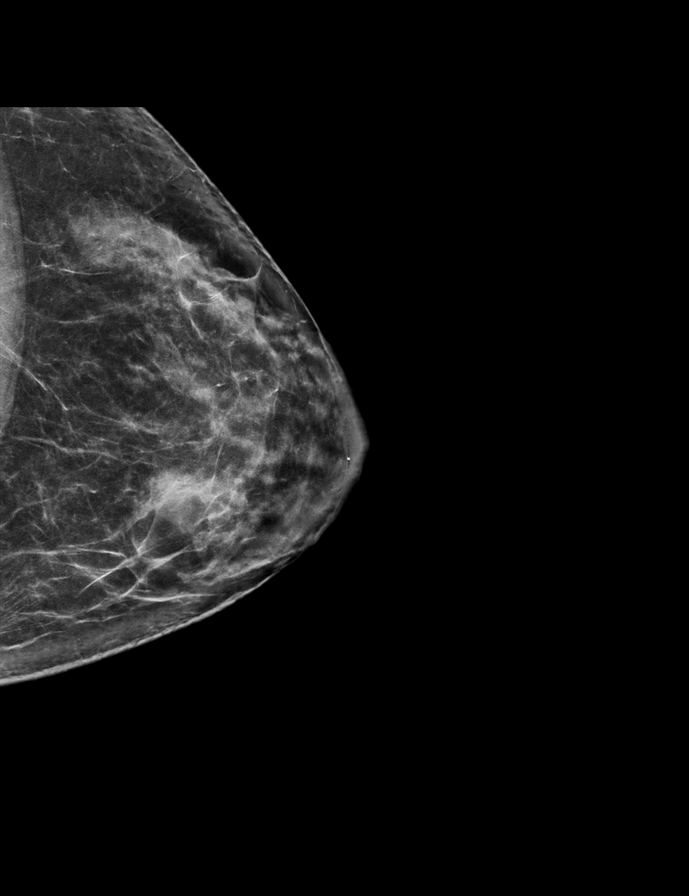

[R CC synth-2D]
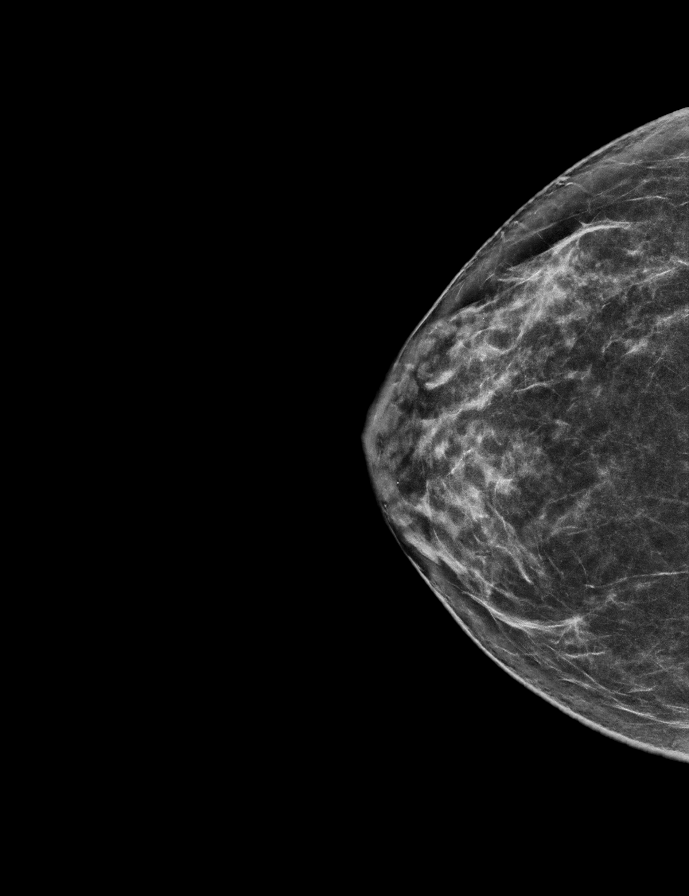

[R MLO synth-2D]
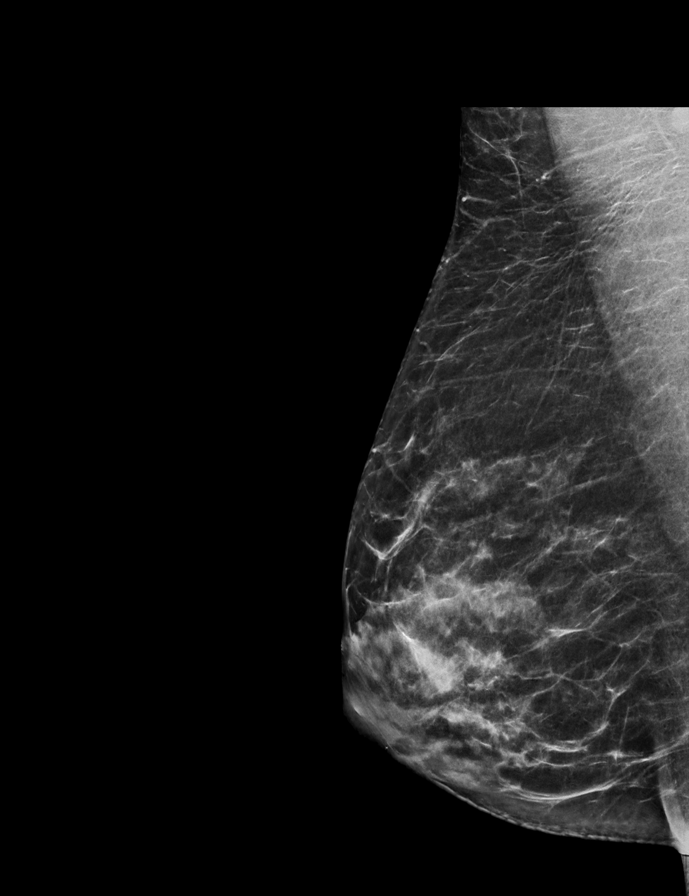

[L MLO synth-2D]
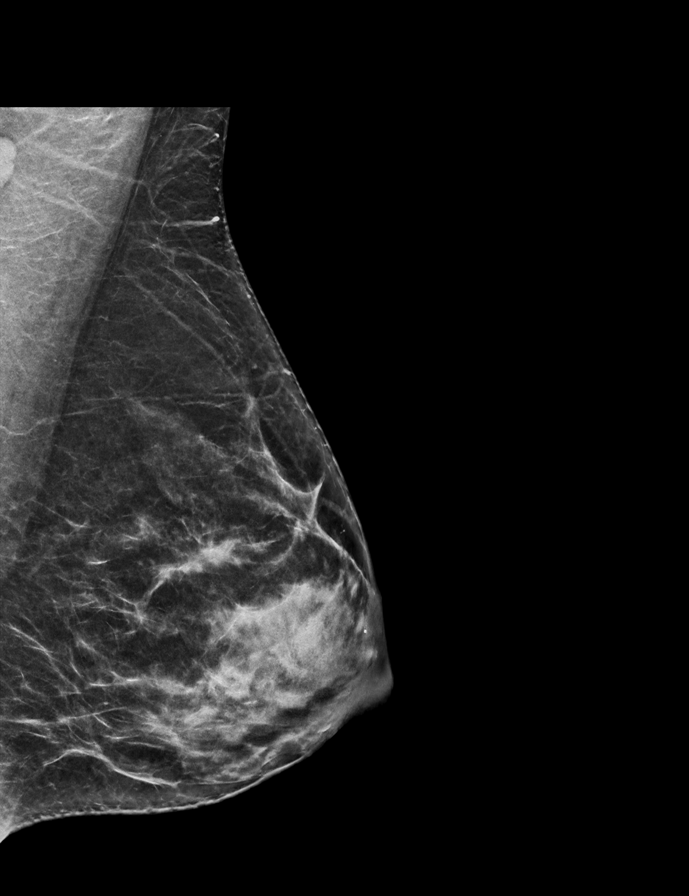

[L CC tomo · 2 of 66 frames shown]
[frame 22/66]
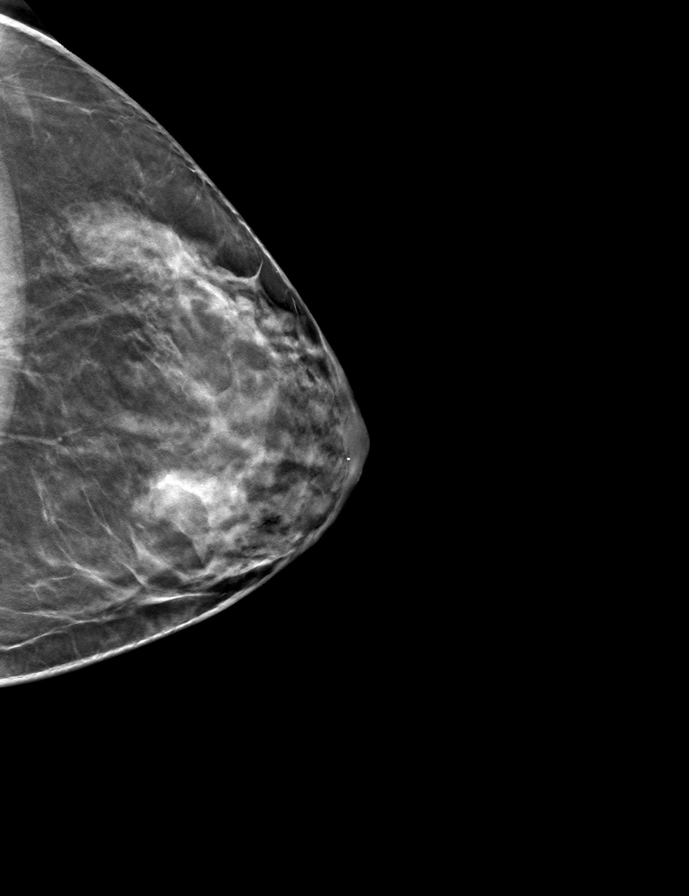
[frame 33/66]
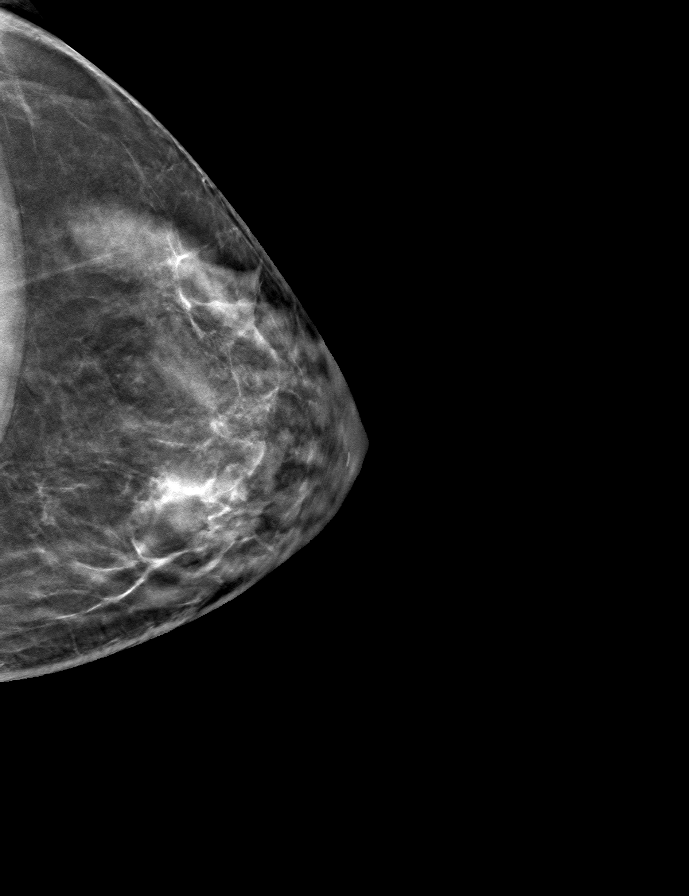

[L MLO tomo · tomo slice 31/61.0]
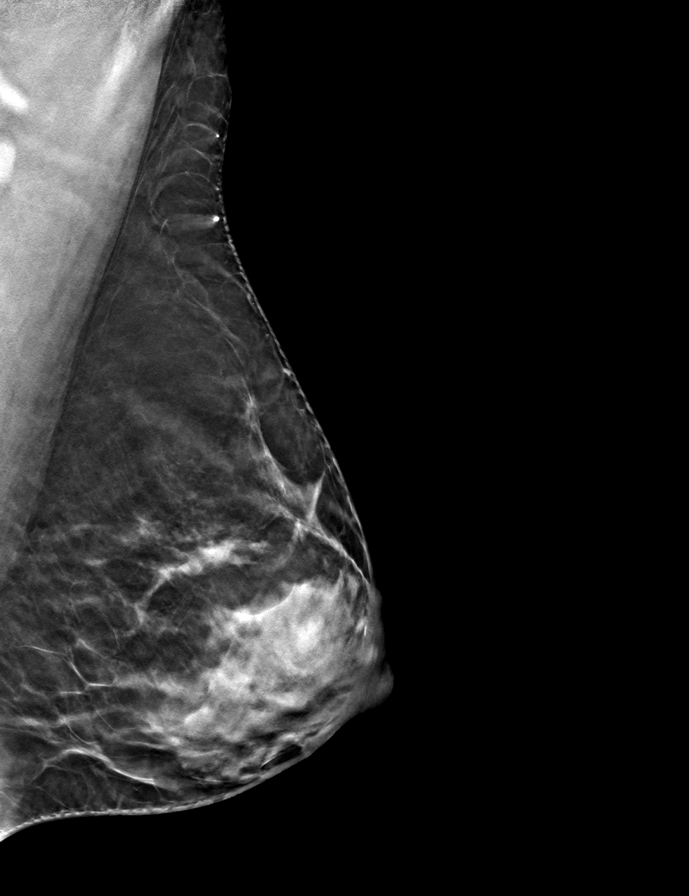

[R MLO tomo · tomo slice 34/67.0]
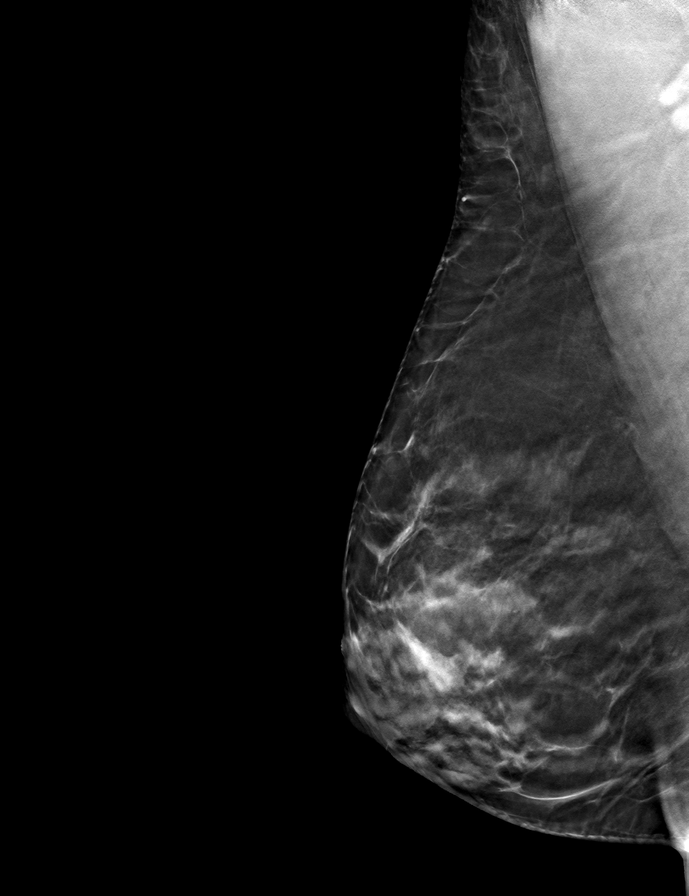

[R CC tomo · tomo slice 32/63.0]
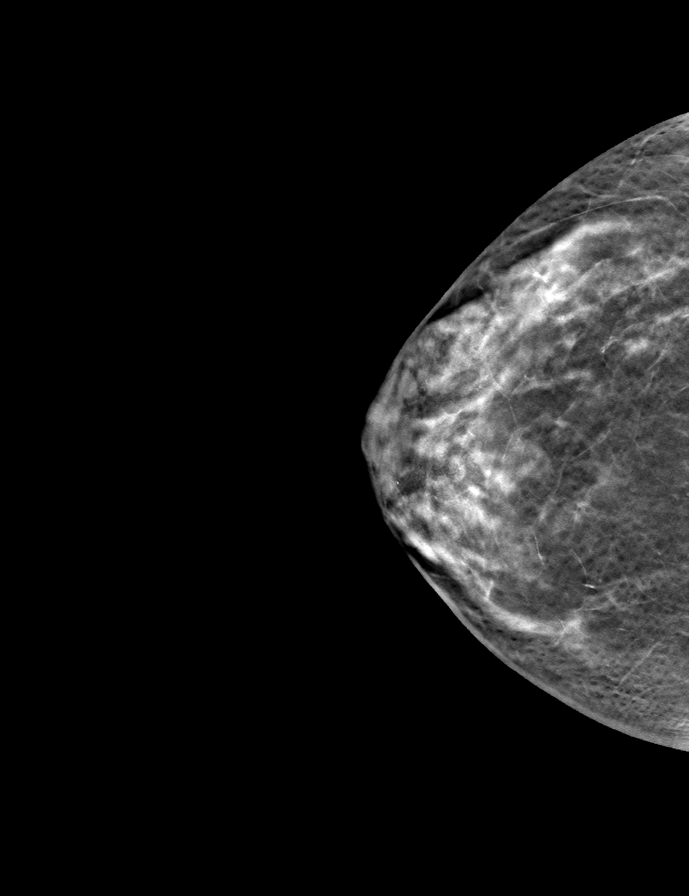

[9 of 24 positions shown; findings below may reference images not displayed]

ACR Breast Density Category c: The breast tissue is heterogeneously
dense, which may obscure small masses.
FINDINGS: There are no findings suspicious for malignancy. Images were
processed with CAD.
IMPRESSION: No mammographic evidence of malignancy. A result letter of this
screening mammogram will be mailed directly to the patient.

RECOMMENDATION:
Screening mammogram in one year. (Code:FT-U-LHB)

BI-RADS CATEGORY  1: Negative.

## 2020-10-07 DIAGNOSIS — H6983 Other specified disorders of Eustachian tube, bilateral: Secondary | ICD-10-CM | POA: Diagnosis not present

## 2020-10-07 DIAGNOSIS — H903 Sensorineural hearing loss, bilateral: Secondary | ICD-10-CM | POA: Diagnosis not present

## 2021-05-04 ENCOUNTER — Other Ambulatory Visit: Payer: Self-pay | Admitting: Family Medicine

## 2021-05-04 DIAGNOSIS — Z1231 Encounter for screening mammogram for malignant neoplasm of breast: Secondary | ICD-10-CM

## 2021-05-13 DIAGNOSIS — J209 Acute bronchitis, unspecified: Secondary | ICD-10-CM | POA: Diagnosis not present

## 2021-05-25 DIAGNOSIS — Z01419 Encounter for gynecological examination (general) (routine) without abnormal findings: Secondary | ICD-10-CM | POA: Diagnosis not present

## 2021-05-25 DIAGNOSIS — Z6821 Body mass index (BMI) 21.0-21.9, adult: Secondary | ICD-10-CM | POA: Diagnosis not present

## 2021-06-20 ENCOUNTER — Other Ambulatory Visit: Payer: Self-pay

## 2021-06-20 ENCOUNTER — Ambulatory Visit
Admission: RE | Admit: 2021-06-20 | Discharge: 2021-06-20 | Disposition: A | Discharge status: Home / Self Care | Payer: BC Managed Care – PPO | Source: Ambulatory Visit | Attending: Family Medicine | Admitting: Family Medicine

## 2021-06-20 DIAGNOSIS — Z1231 Encounter for screening mammogram for malignant neoplasm of breast: Secondary | ICD-10-CM

## 2021-06-22 ENCOUNTER — Ambulatory Visit: Payer: BC Managed Care – PPO

## 2021-11-03 DIAGNOSIS — M9902 Segmental and somatic dysfunction of thoracic region: Secondary | ICD-10-CM | POA: Diagnosis not present

## 2021-11-03 DIAGNOSIS — M25511 Pain in right shoulder: Secondary | ICD-10-CM | POA: Diagnosis not present

## 2021-11-03 DIAGNOSIS — M7918 Myalgia, other site: Secondary | ICD-10-CM | POA: Diagnosis not present

## 2021-11-03 DIAGNOSIS — M542 Cervicalgia: Secondary | ICD-10-CM | POA: Diagnosis not present

## 2021-11-03 DIAGNOSIS — M9901 Segmental and somatic dysfunction of cervical region: Secondary | ICD-10-CM | POA: Diagnosis not present

## 2021-11-10 DIAGNOSIS — M7918 Myalgia, other site: Secondary | ICD-10-CM | POA: Diagnosis not present

## 2021-11-10 DIAGNOSIS — M25511 Pain in right shoulder: Secondary | ICD-10-CM | POA: Diagnosis not present

## 2021-11-10 DIAGNOSIS — M542 Cervicalgia: Secondary | ICD-10-CM | POA: Diagnosis not present

## 2021-11-10 DIAGNOSIS — M9902 Segmental and somatic dysfunction of thoracic region: Secondary | ICD-10-CM | POA: Diagnosis not present

## 2021-11-10 DIAGNOSIS — M9901 Segmental and somatic dysfunction of cervical region: Secondary | ICD-10-CM | POA: Diagnosis not present

## 2021-11-17 DIAGNOSIS — M9901 Segmental and somatic dysfunction of cervical region: Secondary | ICD-10-CM | POA: Diagnosis not present

## 2021-11-17 DIAGNOSIS — M7918 Myalgia, other site: Secondary | ICD-10-CM | POA: Diagnosis not present

## 2021-11-17 DIAGNOSIS — M9902 Segmental and somatic dysfunction of thoracic region: Secondary | ICD-10-CM | POA: Diagnosis not present

## 2021-11-17 DIAGNOSIS — M25511 Pain in right shoulder: Secondary | ICD-10-CM | POA: Diagnosis not present

## 2021-11-17 DIAGNOSIS — M546 Pain in thoracic spine: Secondary | ICD-10-CM | POA: Diagnosis not present

## 2021-11-17 DIAGNOSIS — M542 Cervicalgia: Secondary | ICD-10-CM | POA: Diagnosis not present

## 2021-11-24 DIAGNOSIS — M9901 Segmental and somatic dysfunction of cervical region: Secondary | ICD-10-CM | POA: Diagnosis not present

## 2021-11-24 DIAGNOSIS — M7918 Myalgia, other site: Secondary | ICD-10-CM | POA: Diagnosis not present

## 2021-11-24 DIAGNOSIS — M542 Cervicalgia: Secondary | ICD-10-CM | POA: Diagnosis not present

## 2021-11-24 DIAGNOSIS — M9902 Segmental and somatic dysfunction of thoracic region: Secondary | ICD-10-CM | POA: Diagnosis not present

## 2021-12-01 DIAGNOSIS — M9902 Segmental and somatic dysfunction of thoracic region: Secondary | ICD-10-CM | POA: Diagnosis not present

## 2021-12-01 DIAGNOSIS — M9901 Segmental and somatic dysfunction of cervical region: Secondary | ICD-10-CM | POA: Diagnosis not present

## 2021-12-01 DIAGNOSIS — M9905 Segmental and somatic dysfunction of pelvic region: Secondary | ICD-10-CM | POA: Diagnosis not present

## 2021-12-01 DIAGNOSIS — M9903 Segmental and somatic dysfunction of lumbar region: Secondary | ICD-10-CM | POA: Diagnosis not present

## 2021-12-05 DIAGNOSIS — M9903 Segmental and somatic dysfunction of lumbar region: Secondary | ICD-10-CM | POA: Diagnosis not present

## 2021-12-05 DIAGNOSIS — M9905 Segmental and somatic dysfunction of pelvic region: Secondary | ICD-10-CM | POA: Diagnosis not present

## 2021-12-05 DIAGNOSIS — M9901 Segmental and somatic dysfunction of cervical region: Secondary | ICD-10-CM | POA: Diagnosis not present

## 2021-12-05 DIAGNOSIS — M9902 Segmental and somatic dysfunction of thoracic region: Secondary | ICD-10-CM | POA: Diagnosis not present

## 2021-12-12 DIAGNOSIS — M9901 Segmental and somatic dysfunction of cervical region: Secondary | ICD-10-CM | POA: Diagnosis not present

## 2021-12-12 DIAGNOSIS — M25511 Pain in right shoulder: Secondary | ICD-10-CM | POA: Diagnosis not present

## 2021-12-12 DIAGNOSIS — M9902 Segmental and somatic dysfunction of thoracic region: Secondary | ICD-10-CM | POA: Diagnosis not present

## 2021-12-12 DIAGNOSIS — M545 Low back pain, unspecified: Secondary | ICD-10-CM | POA: Diagnosis not present

## 2021-12-12 DIAGNOSIS — M7918 Myalgia, other site: Secondary | ICD-10-CM | POA: Diagnosis not present

## 2021-12-12 DIAGNOSIS — M9905 Segmental and somatic dysfunction of pelvic region: Secondary | ICD-10-CM | POA: Diagnosis not present

## 2021-12-12 DIAGNOSIS — M9903 Segmental and somatic dysfunction of lumbar region: Secondary | ICD-10-CM | POA: Diagnosis not present

## 2021-12-14 DIAGNOSIS — M9903 Segmental and somatic dysfunction of lumbar region: Secondary | ICD-10-CM | POA: Diagnosis not present

## 2021-12-14 DIAGNOSIS — M25511 Pain in right shoulder: Secondary | ICD-10-CM | POA: Diagnosis not present

## 2021-12-14 DIAGNOSIS — M545 Low back pain, unspecified: Secondary | ICD-10-CM | POA: Diagnosis not present

## 2021-12-14 DIAGNOSIS — M9905 Segmental and somatic dysfunction of pelvic region: Secondary | ICD-10-CM | POA: Diagnosis not present

## 2021-12-14 DIAGNOSIS — M9902 Segmental and somatic dysfunction of thoracic region: Secondary | ICD-10-CM | POA: Diagnosis not present

## 2021-12-14 DIAGNOSIS — M7918 Myalgia, other site: Secondary | ICD-10-CM | POA: Diagnosis not present

## 2021-12-14 DIAGNOSIS — M9901 Segmental and somatic dysfunction of cervical region: Secondary | ICD-10-CM | POA: Diagnosis not present

## 2021-12-19 DIAGNOSIS — M9901 Segmental and somatic dysfunction of cervical region: Secondary | ICD-10-CM | POA: Diagnosis not present

## 2021-12-19 DIAGNOSIS — M7918 Myalgia, other site: Secondary | ICD-10-CM | POA: Diagnosis not present

## 2021-12-19 DIAGNOSIS — M9903 Segmental and somatic dysfunction of lumbar region: Secondary | ICD-10-CM | POA: Diagnosis not present

## 2021-12-19 DIAGNOSIS — M9902 Segmental and somatic dysfunction of thoracic region: Secondary | ICD-10-CM | POA: Diagnosis not present

## 2021-12-19 DIAGNOSIS — M25511 Pain in right shoulder: Secondary | ICD-10-CM | POA: Diagnosis not present

## 2021-12-19 DIAGNOSIS — M545 Low back pain, unspecified: Secondary | ICD-10-CM | POA: Diagnosis not present

## 2021-12-19 DIAGNOSIS — M9905 Segmental and somatic dysfunction of pelvic region: Secondary | ICD-10-CM | POA: Diagnosis not present

## 2021-12-23 DIAGNOSIS — M9902 Segmental and somatic dysfunction of thoracic region: Secondary | ICD-10-CM | POA: Diagnosis not present

## 2021-12-23 DIAGNOSIS — M9901 Segmental and somatic dysfunction of cervical region: Secondary | ICD-10-CM | POA: Diagnosis not present

## 2021-12-23 DIAGNOSIS — M9903 Segmental and somatic dysfunction of lumbar region: Secondary | ICD-10-CM | POA: Diagnosis not present

## 2021-12-23 DIAGNOSIS — M9905 Segmental and somatic dysfunction of pelvic region: Secondary | ICD-10-CM | POA: Diagnosis not present

## 2021-12-28 DIAGNOSIS — M9903 Segmental and somatic dysfunction of lumbar region: Secondary | ICD-10-CM | POA: Diagnosis not present

## 2021-12-28 DIAGNOSIS — M9902 Segmental and somatic dysfunction of thoracic region: Secondary | ICD-10-CM | POA: Diagnosis not present

## 2021-12-28 DIAGNOSIS — M7918 Myalgia, other site: Secondary | ICD-10-CM | POA: Diagnosis not present

## 2021-12-28 DIAGNOSIS — M545 Low back pain, unspecified: Secondary | ICD-10-CM | POA: Diagnosis not present

## 2021-12-28 DIAGNOSIS — M9905 Segmental and somatic dysfunction of pelvic region: Secondary | ICD-10-CM | POA: Diagnosis not present

## 2021-12-28 DIAGNOSIS — M25511 Pain in right shoulder: Secondary | ICD-10-CM | POA: Diagnosis not present

## 2021-12-28 DIAGNOSIS — M9901 Segmental and somatic dysfunction of cervical region: Secondary | ICD-10-CM | POA: Diagnosis not present

## 2022-01-04 DIAGNOSIS — M545 Low back pain, unspecified: Secondary | ICD-10-CM | POA: Diagnosis not present

## 2022-01-04 DIAGNOSIS — M9902 Segmental and somatic dysfunction of thoracic region: Secondary | ICD-10-CM | POA: Diagnosis not present

## 2022-01-04 DIAGNOSIS — M25511 Pain in right shoulder: Secondary | ICD-10-CM | POA: Diagnosis not present

## 2022-01-04 DIAGNOSIS — M7918 Myalgia, other site: Secondary | ICD-10-CM | POA: Diagnosis not present

## 2022-01-04 DIAGNOSIS — M9903 Segmental and somatic dysfunction of lumbar region: Secondary | ICD-10-CM | POA: Diagnosis not present

## 2022-01-04 DIAGNOSIS — M9901 Segmental and somatic dysfunction of cervical region: Secondary | ICD-10-CM | POA: Diagnosis not present

## 2022-01-04 DIAGNOSIS — M9905 Segmental and somatic dysfunction of pelvic region: Secondary | ICD-10-CM | POA: Diagnosis not present

## 2022-01-11 DIAGNOSIS — M9901 Segmental and somatic dysfunction of cervical region: Secondary | ICD-10-CM | POA: Diagnosis not present

## 2022-01-11 DIAGNOSIS — M9905 Segmental and somatic dysfunction of pelvic region: Secondary | ICD-10-CM | POA: Diagnosis not present

## 2022-01-11 DIAGNOSIS — M9902 Segmental and somatic dysfunction of thoracic region: Secondary | ICD-10-CM | POA: Diagnosis not present

## 2022-01-11 DIAGNOSIS — M9903 Segmental and somatic dysfunction of lumbar region: Secondary | ICD-10-CM | POA: Diagnosis not present

## 2022-01-19 DIAGNOSIS — M9905 Segmental and somatic dysfunction of pelvic region: Secondary | ICD-10-CM | POA: Diagnosis not present

## 2022-01-19 DIAGNOSIS — M9903 Segmental and somatic dysfunction of lumbar region: Secondary | ICD-10-CM | POA: Diagnosis not present

## 2022-01-19 DIAGNOSIS — M9902 Segmental and somatic dysfunction of thoracic region: Secondary | ICD-10-CM | POA: Diagnosis not present

## 2022-01-19 DIAGNOSIS — M9901 Segmental and somatic dysfunction of cervical region: Secondary | ICD-10-CM | POA: Diagnosis not present

## 2022-01-25 ENCOUNTER — Other Ambulatory Visit: Payer: Self-pay | Admitting: Family Medicine

## 2022-01-25 DIAGNOSIS — Z1231 Encounter for screening mammogram for malignant neoplasm of breast: Secondary | ICD-10-CM

## 2022-02-01 DIAGNOSIS — M9905 Segmental and somatic dysfunction of pelvic region: Secondary | ICD-10-CM | POA: Diagnosis not present

## 2022-02-01 DIAGNOSIS — M9901 Segmental and somatic dysfunction of cervical region: Secondary | ICD-10-CM | POA: Diagnosis not present

## 2022-02-01 DIAGNOSIS — M7918 Myalgia, other site: Secondary | ICD-10-CM | POA: Diagnosis not present

## 2022-02-01 DIAGNOSIS — M9902 Segmental and somatic dysfunction of thoracic region: Secondary | ICD-10-CM | POA: Diagnosis not present

## 2022-02-01 DIAGNOSIS — M9903 Segmental and somatic dysfunction of lumbar region: Secondary | ICD-10-CM | POA: Diagnosis not present

## 2022-02-01 DIAGNOSIS — M25511 Pain in right shoulder: Secondary | ICD-10-CM | POA: Diagnosis not present

## 2022-02-01 DIAGNOSIS — M545 Low back pain, unspecified: Secondary | ICD-10-CM | POA: Diagnosis not present

## 2022-02-10 DIAGNOSIS — M9902 Segmental and somatic dysfunction of thoracic region: Secondary | ICD-10-CM | POA: Diagnosis not present

## 2022-02-10 DIAGNOSIS — M25511 Pain in right shoulder: Secondary | ICD-10-CM | POA: Diagnosis not present

## 2022-02-10 DIAGNOSIS — M7918 Myalgia, other site: Secondary | ICD-10-CM | POA: Diagnosis not present

## 2022-02-10 DIAGNOSIS — M9903 Segmental and somatic dysfunction of lumbar region: Secondary | ICD-10-CM | POA: Diagnosis not present

## 2022-02-10 DIAGNOSIS — M545 Low back pain, unspecified: Secondary | ICD-10-CM | POA: Diagnosis not present

## 2022-02-10 DIAGNOSIS — M9905 Segmental and somatic dysfunction of pelvic region: Secondary | ICD-10-CM | POA: Diagnosis not present

## 2022-02-10 DIAGNOSIS — M9901 Segmental and somatic dysfunction of cervical region: Secondary | ICD-10-CM | POA: Diagnosis not present

## 2022-06-27 ENCOUNTER — Ambulatory Visit
Admission: RE | Admit: 2022-06-27 | Discharge: 2022-06-27 | Disposition: A | Discharge status: Home / Self Care | Payer: BC Managed Care – PPO | Source: Ambulatory Visit | Attending: Family Medicine | Admitting: Family Medicine

## 2022-06-27 DIAGNOSIS — Z1231 Encounter for screening mammogram for malignant neoplasm of breast: Secondary | ICD-10-CM

## 2022-06-28 DIAGNOSIS — Z124 Encounter for screening for malignant neoplasm of cervix: Secondary | ICD-10-CM | POA: Diagnosis not present

## 2022-06-28 DIAGNOSIS — Z6822 Body mass index (BMI) 22.0-22.9, adult: Secondary | ICD-10-CM | POA: Diagnosis not present

## 2022-06-28 DIAGNOSIS — Z1151 Encounter for screening for human papillomavirus (HPV): Secondary | ICD-10-CM | POA: Diagnosis not present

## 2022-06-28 DIAGNOSIS — Z01419 Encounter for gynecological examination (general) (routine) without abnormal findings: Secondary | ICD-10-CM | POA: Diagnosis not present

## 2022-08-23 DIAGNOSIS — M9906 Segmental and somatic dysfunction of lower extremity: Secondary | ICD-10-CM | POA: Diagnosis not present

## 2022-08-23 DIAGNOSIS — M9904 Segmental and somatic dysfunction of sacral region: Secondary | ICD-10-CM | POA: Diagnosis not present

## 2022-08-23 DIAGNOSIS — M25551 Pain in right hip: Secondary | ICD-10-CM | POA: Diagnosis not present

## 2022-08-23 DIAGNOSIS — M9903 Segmental and somatic dysfunction of lumbar region: Secondary | ICD-10-CM | POA: Diagnosis not present

## 2022-08-23 DIAGNOSIS — M9905 Segmental and somatic dysfunction of pelvic region: Secondary | ICD-10-CM | POA: Diagnosis not present

## 2022-08-23 DIAGNOSIS — M545 Low back pain, unspecified: Secondary | ICD-10-CM | POA: Diagnosis not present

## 2022-08-23 DIAGNOSIS — M7918 Myalgia, other site: Secondary | ICD-10-CM | POA: Diagnosis not present

## 2022-08-26 DIAGNOSIS — N390 Urinary tract infection, site not specified: Secondary | ICD-10-CM | POA: Diagnosis not present

## 2022-09-06 DIAGNOSIS — M9905 Segmental and somatic dysfunction of pelvic region: Secondary | ICD-10-CM | POA: Diagnosis not present

## 2022-09-06 DIAGNOSIS — M7918 Myalgia, other site: Secondary | ICD-10-CM | POA: Diagnosis not present

## 2022-09-06 DIAGNOSIS — M545 Low back pain, unspecified: Secondary | ICD-10-CM | POA: Diagnosis not present

## 2022-09-06 DIAGNOSIS — M25551 Pain in right hip: Secondary | ICD-10-CM | POA: Diagnosis not present

## 2022-09-06 DIAGNOSIS — M9904 Segmental and somatic dysfunction of sacral region: Secondary | ICD-10-CM | POA: Diagnosis not present

## 2022-09-06 DIAGNOSIS — M9903 Segmental and somatic dysfunction of lumbar region: Secondary | ICD-10-CM | POA: Diagnosis not present

## 2022-09-06 DIAGNOSIS — M9906 Segmental and somatic dysfunction of lower extremity: Secondary | ICD-10-CM | POA: Diagnosis not present

## 2023-01-12 DIAGNOSIS — M7918 Myalgia, other site: Secondary | ICD-10-CM | POA: Diagnosis not present

## 2023-01-12 DIAGNOSIS — M222X1 Patellofemoral disorders, right knee: Secondary | ICD-10-CM | POA: Diagnosis not present

## 2023-01-12 DIAGNOSIS — M25561 Pain in right knee: Secondary | ICD-10-CM | POA: Diagnosis not present

## 2023-01-12 DIAGNOSIS — R600 Localized edema: Secondary | ICD-10-CM | POA: Diagnosis not present

## 2023-01-15 DIAGNOSIS — M25561 Pain in right knee: Secondary | ICD-10-CM | POA: Diagnosis not present

## 2023-01-15 DIAGNOSIS — R600 Localized edema: Secondary | ICD-10-CM | POA: Diagnosis not present

## 2023-01-15 DIAGNOSIS — M222X1 Patellofemoral disorders, right knee: Secondary | ICD-10-CM | POA: Diagnosis not present

## 2023-01-15 DIAGNOSIS — M7918 Myalgia, other site: Secondary | ICD-10-CM | POA: Diagnosis not present

## 2023-01-17 DIAGNOSIS — M7918 Myalgia, other site: Secondary | ICD-10-CM | POA: Diagnosis not present

## 2023-01-17 DIAGNOSIS — M25561 Pain in right knee: Secondary | ICD-10-CM | POA: Diagnosis not present

## 2023-01-17 DIAGNOSIS — M222X1 Patellofemoral disorders, right knee: Secondary | ICD-10-CM | POA: Diagnosis not present

## 2023-01-17 DIAGNOSIS — R600 Localized edema: Secondary | ICD-10-CM | POA: Diagnosis not present

## 2023-02-12 DIAGNOSIS — M25561 Pain in right knee: Secondary | ICD-10-CM | POA: Diagnosis not present

## 2023-02-12 DIAGNOSIS — R600 Localized edema: Secondary | ICD-10-CM | POA: Diagnosis not present

## 2023-02-12 DIAGNOSIS — M222X1 Patellofemoral disorders, right knee: Secondary | ICD-10-CM | POA: Diagnosis not present

## 2023-02-12 DIAGNOSIS — M7918 Myalgia, other site: Secondary | ICD-10-CM | POA: Diagnosis not present

## 2023-02-14 DIAGNOSIS — M25561 Pain in right knee: Secondary | ICD-10-CM | POA: Diagnosis not present

## 2023-02-14 DIAGNOSIS — R600 Localized edema: Secondary | ICD-10-CM | POA: Diagnosis not present

## 2023-02-14 DIAGNOSIS — M7918 Myalgia, other site: Secondary | ICD-10-CM | POA: Diagnosis not present

## 2023-02-14 DIAGNOSIS — M222X1 Patellofemoral disorders, right knee: Secondary | ICD-10-CM | POA: Diagnosis not present

## 2023-02-20 DIAGNOSIS — M7918 Myalgia, other site: Secondary | ICD-10-CM | POA: Diagnosis not present

## 2023-02-20 DIAGNOSIS — R600 Localized edema: Secondary | ICD-10-CM | POA: Diagnosis not present

## 2023-02-20 DIAGNOSIS — M222X1 Patellofemoral disorders, right knee: Secondary | ICD-10-CM | POA: Diagnosis not present

## 2023-02-20 DIAGNOSIS — M25561 Pain in right knee: Secondary | ICD-10-CM | POA: Diagnosis not present

## 2023-03-01 DIAGNOSIS — M25561 Pain in right knee: Secondary | ICD-10-CM | POA: Diagnosis not present

## 2023-03-01 DIAGNOSIS — M222X1 Patellofemoral disorders, right knee: Secondary | ICD-10-CM | POA: Diagnosis not present

## 2023-03-01 DIAGNOSIS — M7918 Myalgia, other site: Secondary | ICD-10-CM | POA: Diagnosis not present

## 2023-03-01 DIAGNOSIS — R600 Localized edema: Secondary | ICD-10-CM | POA: Diagnosis not present

## 2023-03-06 ENCOUNTER — Ambulatory Visit
Admission: RE | Admit: 2023-03-06 | Discharge: 2023-03-06 | Disposition: A | Discharge status: Home / Self Care | Payer: 59 | Source: Ambulatory Visit | Attending: Sports Medicine | Admitting: Sports Medicine

## 2023-03-06 ENCOUNTER — Ambulatory Visit (INDEPENDENT_AMBULATORY_CARE_PROVIDER_SITE_OTHER): Payer: 59 | Admitting: Sports Medicine

## 2023-03-06 VITALS — BP 126/84 | Ht 65.5 in | Wt 140.0 lb

## 2023-03-06 DIAGNOSIS — M1711 Unilateral primary osteoarthritis, right knee: Secondary | ICD-10-CM | POA: Diagnosis not present

## 2023-03-06 DIAGNOSIS — M25561 Pain in right knee: Secondary | ICD-10-CM

## 2023-03-06 DIAGNOSIS — G8929 Other chronic pain: Secondary | ICD-10-CM

## 2023-03-07 ENCOUNTER — Encounter: Payer: Self-pay | Admitting: Sports Medicine

## 2023-03-07 NOTE — Progress Notes (Signed)
   Subjective:    Patient ID: Loretta Simpson, female    DOB: 06-Sep-1961, 62 y.o.   MRN: [id number]  HPI chief complaint: Right knee pain  Patient is a very active 62 year old female that presents today complaining of right knee pain has been present since January.  She denies any known trauma.  Her pain was initially in the anterior knee but has since moved to the medial and posterior knee.  She found it very difficult to flex the knee without pain and tightness.  She has been treated by Dr. Thereasa Distance which has been helpful.  She has been able to regain her range of motion but has been hesitant about returning to running.  She stopped running at the end of February and has been walking instead.  She was most recently able to walk 5 miles this past weekend without any significant return in symptoms.  She does get occasional popping in the knee but denies pain with this.  She does get pain at night.  Advil does help occasionally and she has also found benefit with KT tape.  No prior knee surgeries.  She did injure this same knee a couple of years ago by suffering a hyperflexion injury but had no lingering symptoms from that injury.  She was referred to our office by Dr. Vear Clock I have concerned about possible Baker's cyst.  She has had no recent imaging.  Past medical history reviewed Medications reviewed Allergies reviewed   Review of Systems As above    Objective:   Physical Exam  Well-developed, fit appearing.  No acute distress  Right knee: Full range of motion.  No obvious effusion on exam.  She is tender to palpation along the medial joint line with a positive Thessaly's.  Some mild tenderness along the lateral joint line.  Negative McMurray's.  1-2+ patellofemoral crepitus.  Knee is stable to both valgus and varus stressing.  Negative anterior drawer, negative posterior drawer.  Neurovascularly intact distally.    Limited bedside ultrasound of the right knee does show a small knee  effusion.  There is also a small amount of spurring along the medial joint space with some extrusion of the medial meniscus but no obvious meniscus tear.  Lateral joint space is unremarkable.  There is no Baker's cyst in the popliteal fossa.  X-rays of the right knee including AP, lateral, and sunrise views shows mild tricompartmental changes with some mild medial compartmental joint space loss.  Nothing acute.      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  Patient is already well versed on quad and hip strengthening exercises and I have encouraged her to continue with those.  I think she is okay to resume some limited running, first in the form of a walk run program.  She will be gradual about increasing this.  She may continue to work with Dr. Vear Clock as well if she finds this treatment to be helpful.  We did discuss the possibility of a cortisone injection down the road if her symptoms warrant.  Follow-up for ongoing or recalcitrant issues.  This note was dictated using Dragon naturally speaking software and may contain errors in syntax, spelling, or content which have not been identified prior to signing this note.

## 2023-03-13 DIAGNOSIS — M25561 Pain in right knee: Secondary | ICD-10-CM | POA: Diagnosis not present

## 2023-03-13 DIAGNOSIS — M222X1 Patellofemoral disorders, right knee: Secondary | ICD-10-CM | POA: Diagnosis not present

## 2023-03-13 DIAGNOSIS — R600 Localized edema: Secondary | ICD-10-CM | POA: Diagnosis not present

## 2023-03-13 DIAGNOSIS — M7918 Myalgia, other site: Secondary | ICD-10-CM | POA: Diagnosis not present

## 2023-03-14 ENCOUNTER — Other Ambulatory Visit: Payer: Self-pay | Admitting: Chiropractic Medicine

## 2023-03-14 DIAGNOSIS — G8929 Other chronic pain: Secondary | ICD-10-CM

## 2023-03-16 ENCOUNTER — Encounter: Payer: Self-pay | Admitting: Chiropractic Medicine

## 2023-03-18 ENCOUNTER — Ambulatory Visit
Admission: RE | Admit: 2023-03-18 | Discharge: 2023-03-18 | Disposition: A | Discharge status: Home / Self Care | Payer: 59 | Source: Ambulatory Visit | Attending: Chiropractic Medicine | Admitting: Chiropractic Medicine

## 2023-03-18 DIAGNOSIS — M23321 Other meniscus derangements, posterior horn of medial meniscus, right knee: Secondary | ICD-10-CM | POA: Diagnosis not present

## 2023-03-18 DIAGNOSIS — G8929 Other chronic pain: Secondary | ICD-10-CM

## 2023-03-18 DIAGNOSIS — M948X6 Other specified disorders of cartilage, lower leg: Secondary | ICD-10-CM | POA: Diagnosis not present

## 2023-03-27 DIAGNOSIS — G8929 Other chronic pain: Secondary | ICD-10-CM | POA: Diagnosis not present

## 2023-03-27 DIAGNOSIS — M25561 Pain in right knee: Secondary | ICD-10-CM | POA: Diagnosis not present

## 2023-04-04 DIAGNOSIS — X58XXXA Exposure to other specified factors, initial encounter: Secondary | ICD-10-CM | POA: Diagnosis not present

## 2023-04-04 DIAGNOSIS — M659 Synovitis and tenosynovitis, unspecified: Secondary | ICD-10-CM | POA: Diagnosis not present

## 2023-04-04 DIAGNOSIS — M65861 Other synovitis and tenosynovitis, right lower leg: Secondary | ICD-10-CM | POA: Diagnosis not present

## 2023-04-04 DIAGNOSIS — M2241 Chondromalacia patellae, right knee: Secondary | ICD-10-CM | POA: Diagnosis not present

## 2023-04-04 DIAGNOSIS — G8918 Other acute postprocedural pain: Secondary | ICD-10-CM | POA: Diagnosis not present

## 2023-04-04 DIAGNOSIS — Y999 Unspecified external cause status: Secondary | ICD-10-CM | POA: Diagnosis not present

## 2023-04-04 DIAGNOSIS — M94261 Chondromalacia, right knee: Secondary | ICD-10-CM | POA: Diagnosis not present

## 2023-04-04 DIAGNOSIS — S83231A Complex tear of medial meniscus, current injury, right knee, initial encounter: Secondary | ICD-10-CM | POA: Diagnosis not present

## 2023-04-04 DIAGNOSIS — M6752 Plica syndrome, left knee: Secondary | ICD-10-CM | POA: Diagnosis not present

## 2023-04-04 DIAGNOSIS — M6751 Plica syndrome, right knee: Secondary | ICD-10-CM | POA: Diagnosis not present

## 2023-04-11 DIAGNOSIS — M25561 Pain in right knee: Secondary | ICD-10-CM | POA: Diagnosis not present

## 2023-04-11 DIAGNOSIS — Z471 Aftercare following joint replacement surgery: Secondary | ICD-10-CM | POA: Diagnosis not present

## 2023-04-11 DIAGNOSIS — Z7409 Other reduced mobility: Secondary | ICD-10-CM | POA: Diagnosis not present

## 2023-04-11 DIAGNOSIS — Z789 Other specified health status: Secondary | ICD-10-CM | POA: Diagnosis not present

## 2023-04-11 DIAGNOSIS — R29898 Other symptoms and signs involving the musculoskeletal system: Secondary | ICD-10-CM | POA: Diagnosis not present

## 2023-04-11 DIAGNOSIS — Z9889 Other specified postprocedural states: Secondary | ICD-10-CM | POA: Diagnosis not present

## 2023-04-11 DIAGNOSIS — M25661 Stiffness of right knee, not elsewhere classified: Secondary | ICD-10-CM | POA: Diagnosis not present

## 2023-05-07 DIAGNOSIS — Z789 Other specified health status: Secondary | ICD-10-CM | POA: Diagnosis not present

## 2023-05-07 DIAGNOSIS — R29898 Other symptoms and signs involving the musculoskeletal system: Secondary | ICD-10-CM | POA: Diagnosis not present

## 2023-05-07 DIAGNOSIS — Z7409 Other reduced mobility: Secondary | ICD-10-CM | POA: Diagnosis not present

## 2023-05-07 DIAGNOSIS — M25661 Stiffness of right knee, not elsewhere classified: Secondary | ICD-10-CM | POA: Diagnosis not present

## 2023-05-07 DIAGNOSIS — Z9889 Other specified postprocedural states: Secondary | ICD-10-CM | POA: Diagnosis not present

## 2023-05-10 DIAGNOSIS — Z09 Encounter for follow-up examination after completed treatment for conditions other than malignant neoplasm: Secondary | ICD-10-CM | POA: Diagnosis not present

## 2023-05-15 ENCOUNTER — Other Ambulatory Visit: Payer: Self-pay | Admitting: Obstetrics and Gynecology

## 2023-05-15 DIAGNOSIS — Z1231 Encounter for screening mammogram for malignant neoplasm of breast: Secondary | ICD-10-CM

## 2023-06-29 ENCOUNTER — Ambulatory Visit: Payer: 59

## 2023-07-02 ENCOUNTER — Ambulatory Visit
Admission: RE | Admit: 2023-07-02 | Discharge: 2023-07-02 | Disposition: A | Discharge status: Home / Self Care | Payer: 59 | Source: Ambulatory Visit | Attending: Obstetrics and Gynecology | Admitting: Obstetrics and Gynecology

## 2023-07-02 DIAGNOSIS — Z1231 Encounter for screening mammogram for malignant neoplasm of breast: Secondary | ICD-10-CM

## 2023-07-04 DIAGNOSIS — Z1331 Encounter for screening for depression: Secondary | ICD-10-CM | POA: Diagnosis not present

## 2023-07-04 DIAGNOSIS — Z01419 Encounter for gynecological examination (general) (routine) without abnormal findings: Secondary | ICD-10-CM | POA: Diagnosis not present

## 2023-08-23 DIAGNOSIS — Z800 Family history of malignant neoplasm of digestive organs: Secondary | ICD-10-CM | POA: Diagnosis not present

## 2023-08-23 DIAGNOSIS — K649 Unspecified hemorrhoids: Secondary | ICD-10-CM | POA: Diagnosis not present

## 2023-08-23 DIAGNOSIS — Z1211 Encounter for screening for malignant neoplasm of colon: Secondary | ICD-10-CM | POA: Diagnosis not present

## 2023-08-24 ENCOUNTER — Other Ambulatory Visit: Payer: Self-pay | Admitting: Gastroenterology

## 2023-08-24 DIAGNOSIS — R933 Abnormal findings on diagnostic imaging of other parts of digestive tract: Secondary | ICD-10-CM

## 2023-08-27 ENCOUNTER — Encounter: Payer: Self-pay | Admitting: Gastroenterology

## 2023-09-05 ENCOUNTER — Ambulatory Visit
Admission: RE | Admit: 2023-09-05 | Discharge: 2023-09-05 | Disposition: A | Discharge status: Home / Self Care | Payer: 59 | Source: Ambulatory Visit | Attending: Gastroenterology | Admitting: Gastroenterology

## 2023-09-05 DIAGNOSIS — R933 Abnormal findings on diagnostic imaging of other parts of digestive tract: Secondary | ICD-10-CM

## 2023-09-05 MED ORDER — IOPAMIDOL (ISOVUE-300) INJECTION 61%
200.0000 mL | Freq: Once | INTRAVENOUS | Status: AC | PRN
  Administered 2023-09-05: 100 mL via INTRAVENOUS

## 2023-09-12 DIAGNOSIS — Z808 Family history of malignant neoplasm of other organs or systems: Secondary | ICD-10-CM | POA: Diagnosis not present

## 2023-09-12 DIAGNOSIS — L814 Other melanin hyperpigmentation: Secondary | ICD-10-CM | POA: Diagnosis not present

## 2023-09-12 DIAGNOSIS — D225 Melanocytic nevi of trunk: Secondary | ICD-10-CM | POA: Diagnosis not present

## 2023-09-12 DIAGNOSIS — D485 Neoplasm of uncertain behavior of skin: Secondary | ICD-10-CM | POA: Diagnosis not present

## 2023-09-12 DIAGNOSIS — C4441 Basal cell carcinoma of skin of scalp and neck: Secondary | ICD-10-CM | POA: Diagnosis not present

## 2023-09-12 DIAGNOSIS — L821 Other seborrheic keratosis: Secondary | ICD-10-CM | POA: Diagnosis not present

## 2023-09-12 DIAGNOSIS — D2271 Melanocytic nevi of right lower limb, including hip: Secondary | ICD-10-CM | POA: Diagnosis not present

## 2023-09-12 DIAGNOSIS — Z7189 Other specified counseling: Secondary | ICD-10-CM | POA: Diagnosis not present

## 2023-09-12 DIAGNOSIS — L820 Inflamed seborrheic keratosis: Secondary | ICD-10-CM | POA: Diagnosis not present

## 2023-11-19 DIAGNOSIS — C44519 Basal cell carcinoma of skin of other part of trunk: Secondary | ICD-10-CM | POA: Diagnosis not present

## 2024-05-06 ENCOUNTER — Other Ambulatory Visit: Payer: Self-pay | Admitting: Obstetrics and Gynecology

## 2024-05-06 DIAGNOSIS — Z1231 Encounter for screening mammogram for malignant neoplasm of breast: Secondary | ICD-10-CM

## 2024-07-03 ENCOUNTER — Ambulatory Visit
Admission: RE | Admit: 2024-07-03 | Discharge: 2024-07-03 | Disposition: A | Discharge status: Home / Self Care | Source: Ambulatory Visit | Attending: Obstetrics and Gynecology | Admitting: Obstetrics and Gynecology

## 2024-07-03 DIAGNOSIS — Z1231 Encounter for screening mammogram for malignant neoplasm of breast: Secondary | ICD-10-CM | POA: Diagnosis not present

## 2024-07-09 DIAGNOSIS — Z01419 Encounter for gynecological examination (general) (routine) without abnormal findings: Secondary | ICD-10-CM | POA: Diagnosis not present
